# Patient Record
Sex: Male | Born: 1985 | Race: White | Hispanic: No | Marital: Single | Smoking: Former smoker
Health system: Southern US, Community
[De-identification: ages and names within clinical notes are randomized; demographics above are authoritative.]

## PROBLEM LIST (undated history)

## (undated) HISTORY — PX: TONSILLECTOMY: SUR1361

## (undated) HISTORY — PX: NOSE SURGERY: SHX723

---
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
## 2004-05-07 ENCOUNTER — Emergency Department: Payer: Self-pay | Admitting: Emergency Medicine

## 2004-12-15 ENCOUNTER — Emergency Department: Payer: Self-pay | Admitting: Emergency Medicine

## 2005-07-22 ENCOUNTER — Emergency Department: Payer: Self-pay | Admitting: Emergency Medicine

## 2005-10-29 ENCOUNTER — Emergency Department: Payer: Self-pay | Admitting: Emergency Medicine

## 2006-07-22 ENCOUNTER — Emergency Department: Payer: Self-pay | Admitting: Emergency Medicine

## 2007-01-02 ENCOUNTER — Emergency Department: Payer: Self-pay

## 2007-01-30 ENCOUNTER — Other Ambulatory Visit: Payer: Self-pay

## 2007-01-30 ENCOUNTER — Emergency Department: Payer: Self-pay | Admitting: Emergency Medicine

## 2007-05-19 ENCOUNTER — Emergency Department: Payer: Self-pay | Admitting: Emergency Medicine

## 2008-02-03 ENCOUNTER — Emergency Department: Payer: Self-pay | Admitting: Emergency Medicine

## 2008-04-02 ENCOUNTER — Emergency Department: Payer: Self-pay | Admitting: Emergency Medicine

## 2008-04-03 ENCOUNTER — Ambulatory Visit: Payer: Self-pay | Admitting: Emergency Medicine

## 2008-05-06 ENCOUNTER — Emergency Department: Payer: Self-pay | Admitting: Emergency Medicine

## 2008-09-15 ENCOUNTER — Emergency Department: Payer: Self-pay | Admitting: Emergency Medicine

## 2008-10-26 ENCOUNTER — Emergency Department: Payer: Self-pay | Admitting: Internal Medicine

## 2009-12-31 ENCOUNTER — Emergency Department: Payer: Self-pay | Admitting: Emergency Medicine

## 2011-06-02 ENCOUNTER — Emergency Department: Payer: Self-pay | Admitting: Emergency Medicine

## 2011-10-13 ENCOUNTER — Emergency Department: Payer: Self-pay | Admitting: Emergency Medicine

## 2012-04-19 ENCOUNTER — Emergency Department: Payer: Self-pay | Admitting: Emergency Medicine

## 2012-04-19 LAB — URINALYSIS, COMPLETE
Blood: NEGATIVE
Glucose,UR: NEGATIVE mg/dL (ref 0–75)
Ketone: NEGATIVE
Leukocyte Esterase: NEGATIVE
Ph: 5 (ref 4.5–8.0)
Protein: NEGATIVE
RBC,UR: <1 /HPF (ref 0–5)
WBC UR: 1 /HPF (ref 0–5)

## 2012-04-19 LAB — COMPREHENSIVE METABOLIC PANEL
Albumin: 4.2 g/dL (ref 3.4–5.0)
Alkaline Phosphatase: 85 U/L (ref 50–136)
Anion Gap: 7 (ref 7–16)
BUN: 26 mg/dL — ABNORMAL HIGH (ref 7–18)
Bilirubin,Total: 1.1 mg/dL — ABNORMAL HIGH (ref 0.2–1.0)
Calcium, Total: 8.9 mg/dL (ref 8.5–10.1)
Glucose: 102 mg/dL — ABNORMAL HIGH (ref 65–99)
Potassium: 3.8 mmol/L (ref 3.5–5.1)
SGOT(AST): 32 U/L (ref 15–37)
Sodium: 137 mmol/L (ref 136–145)
Total Protein: 7.8 g/dL (ref 6.4–8.2)

## 2012-04-19 LAB — CBC
HCT: 48.7 % (ref 40.0–52.0)
HGB: 17 g/dL (ref 13.0–18.0)
MCHC: 34.8 g/dL (ref 32.0–36.0)
MCV: 86 fL (ref 80–100)
RBC: 5.67 10*6/uL (ref 4.40–5.90)
RDW: 12.6 % (ref 11.5–14.5)

## 2013-02-21 ENCOUNTER — Emergency Department: Payer: Self-pay | Admitting: Emergency Medicine

## 2013-06-17 ENCOUNTER — Emergency Department: Payer: Self-pay | Admitting: Emergency Medicine

## 2013-06-17 LAB — URINALYSIS, COMPLETE
BLOOD: NEGATIVE
Bacteria: NONE SEEN
Bilirubin,UR: NEGATIVE
GLUCOSE, UR: NEGATIVE mg/dL (ref 0–75)
KETONE: NEGATIVE
Leukocyte Esterase: NEGATIVE
Nitrite: NEGATIVE
Ph: 7 (ref 4.5–8.0)
Protein: NEGATIVE
RBC,UR: <1 /HPF (ref 0–5)
Specific Gravity: 1.017 (ref 1.003–1.030)

## 2013-06-17 LAB — COMPREHENSIVE METABOLIC PANEL
ANION GAP: 5 — AB (ref 7–16)
AST: 13 U/L — AB (ref 15–37)
Albumin: 4.6 g/dL (ref 3.4–5.0)
Alkaline Phosphatase: 71 U/L
BILIRUBIN TOTAL: 1 mg/dL (ref 0.2–1.0)
BUN: 20 mg/dL — ABNORMAL HIGH (ref 7–18)
Calcium, Total: 9.6 mg/dL (ref 8.5–10.1)
Chloride: 105 mmol/L (ref 98–107)
Co2: 29 mmol/L (ref 21–32)
Creatinine: 0.79 mg/dL (ref 0.60–1.30)
EGFR (African American): >60
EGFR (Non-African Amer.): >60
GLUCOSE: 119 mg/dL — AB (ref 65–99)
Osmolality: 281 (ref 275–301)
Potassium: 3.3 mmol/L — ABNORMAL LOW (ref 3.5–5.1)
SGPT (ALT): 25 U/L (ref 12–78)
SODIUM: 139 mmol/L (ref 136–145)
TOTAL PROTEIN: 7.9 g/dL (ref 6.4–8.2)

## 2013-06-17 LAB — CBC
HCT: 46.7 % (ref 40.0–52.0)
HGB: 15.9 g/dL (ref 13.0–18.0)
MCH: 29.2 pg (ref 26.0–34.0)
MCHC: 34 g/dL (ref 32.0–36.0)
MCV: 86 fL (ref 80–100)
PLATELETS: 183 10*3/uL (ref 150–440)
RBC: 5.44 10*6/uL (ref 4.40–5.90)
RDW: 13.1 % (ref 11.5–14.5)
WBC: 7.9 10*3/uL (ref 3.8–10.6)

## 2013-06-17 LAB — LIPASE, BLOOD: Lipase: 826 U/L — ABNORMAL HIGH (ref 73–393)

## 2013-06-17 LAB — ETHANOL: Ethanol %: <0.003 % (ref 0.000–0.080)

## 2014-06-21 IMAGING — CT CT ABD-PELV W/ CM
2 of 5 series · 16 of 46 positions shown, 18 images · IV contrast (isovue)
Comparison: 04/02/2008

CLINICAL DATA: Left upper quadrant/left flank pain, weight loss

EXAM:
CT ABDOMEN AND PELVIS WITH CONTRAST
TECHNIQUE: Multidetector CT imaging of the abdomen and pelvis was performed
using the standard protocol following bolus administration of
intravenous contrast.
CONTRAST:  100 mL Isovue 370 IV

[Series 2: routine abd pel with · axial · 0.68mm/px · z∈[-416,-40]mm · 13 of 85 slices shown, 15 images]
[im 5/85  soft-tissue]
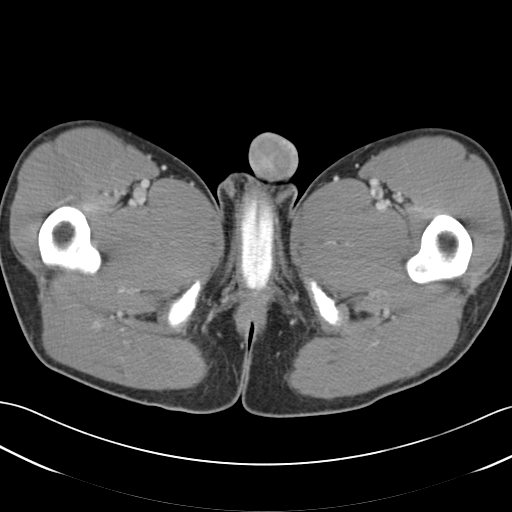
[im 5/85  bone]
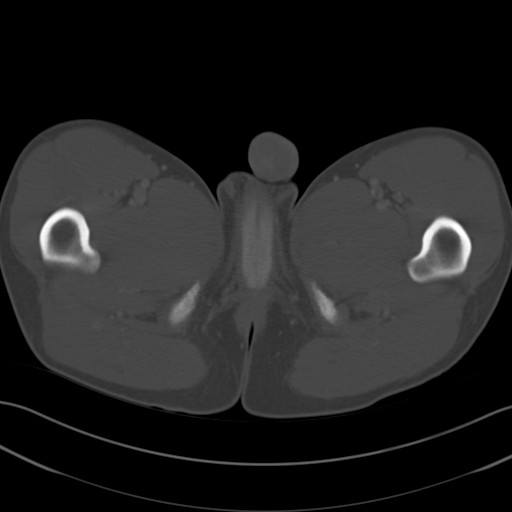
[im 13/85  soft-tissue]
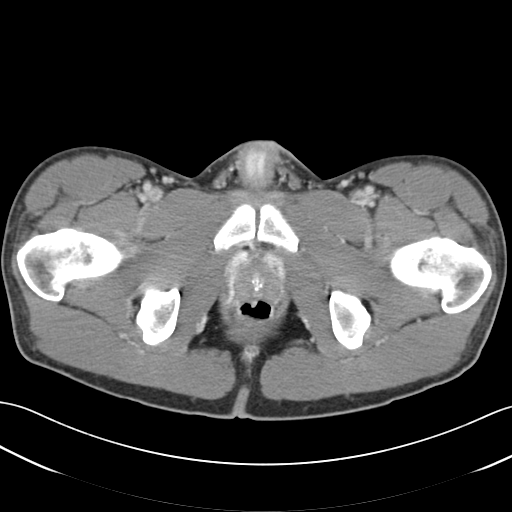
[im 17/85  soft-tissue]
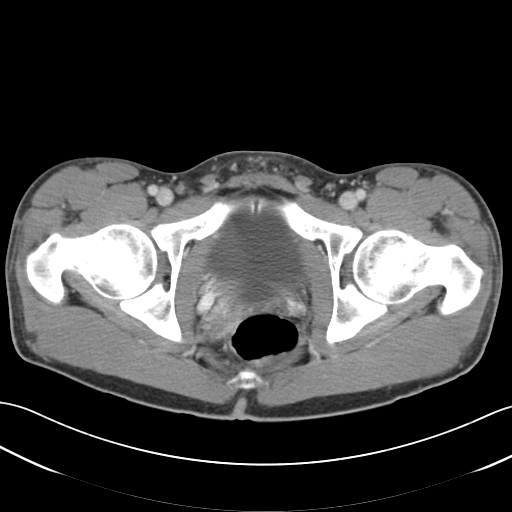
[im 26/85  soft-tissue]
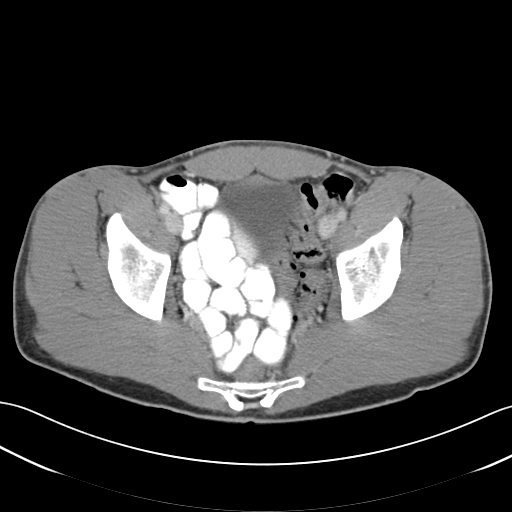
[im 30/85  soft-tissue]
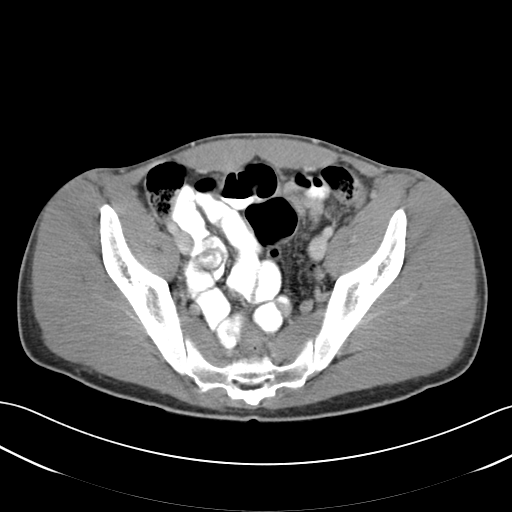
[im 38/85  soft-tissue]
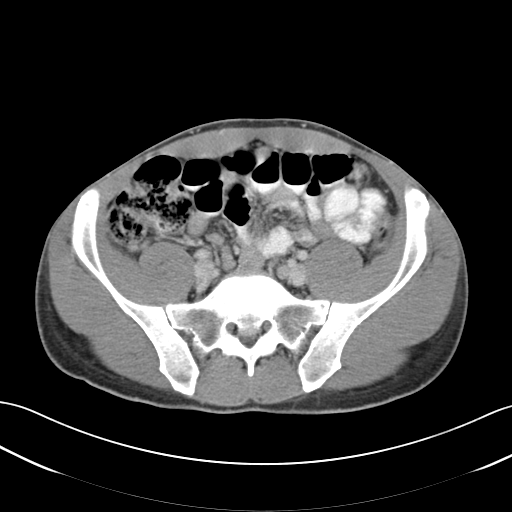
[im 43/85  soft-tissue]
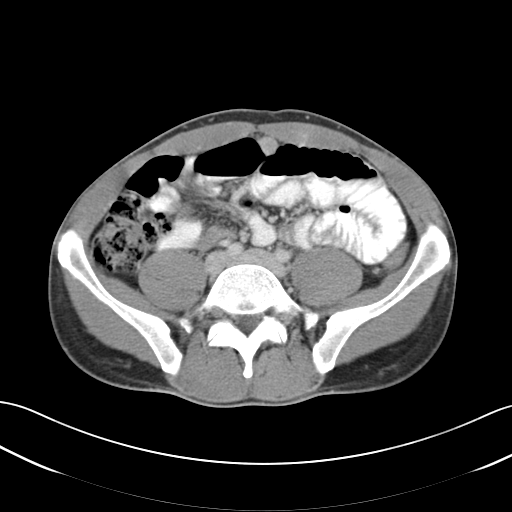
[im 47/85  soft-tissue]
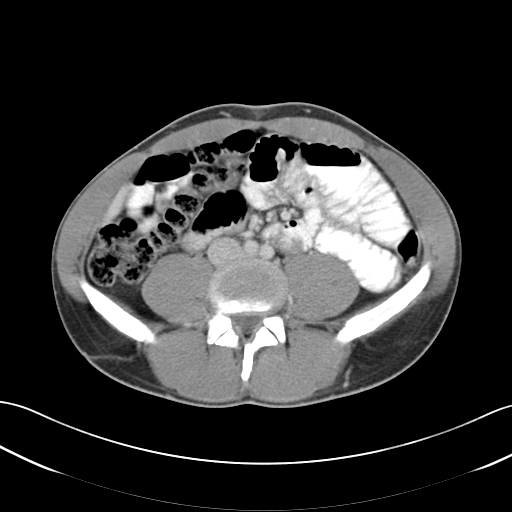
[im 55/85  soft-tissue]
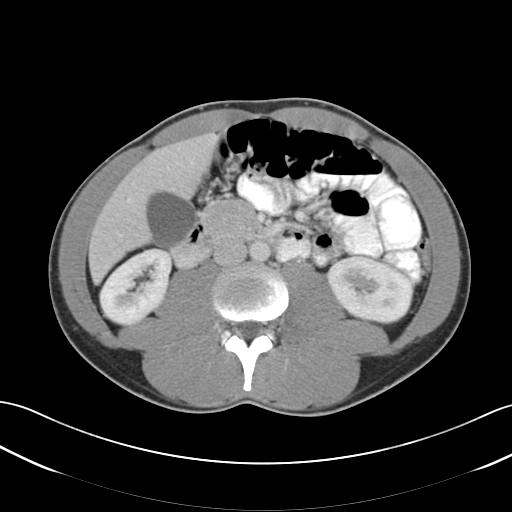
[im 55/85  bone]
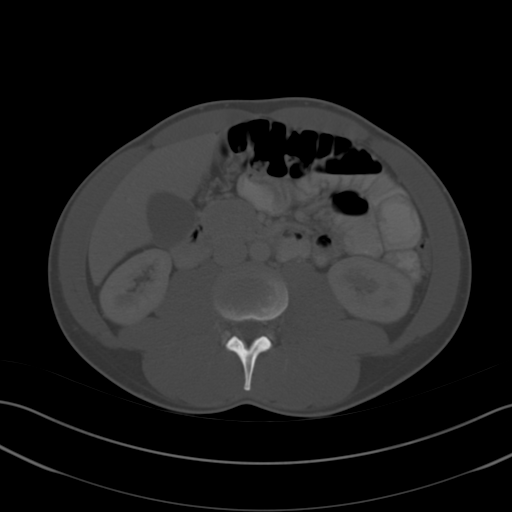
[im 59/85  soft-tissue]
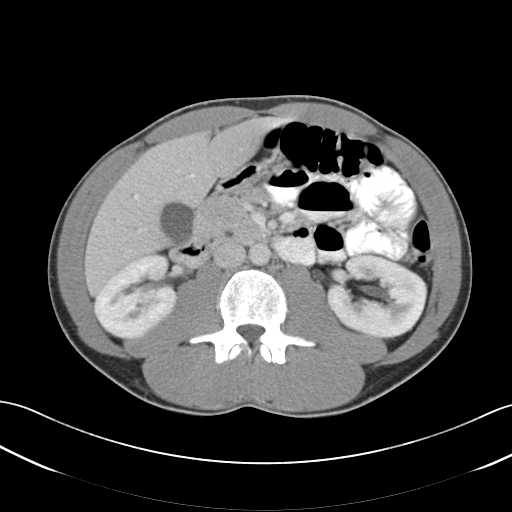
[im 68/85  soft-tissue]
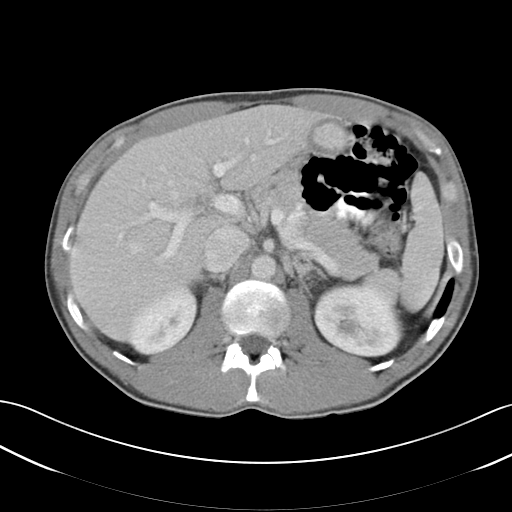
[im 72/85  soft-tissue]
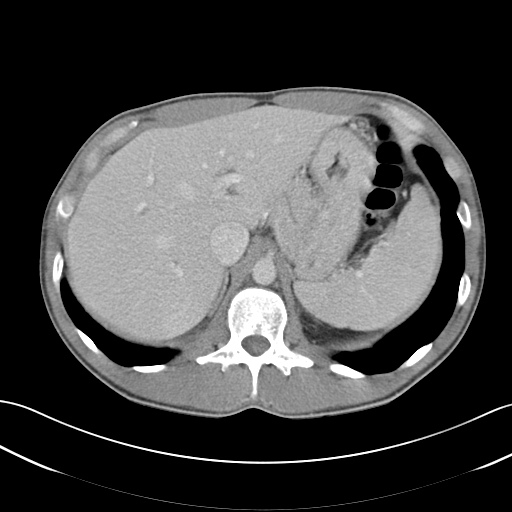
[im 80/85  soft-tissue]
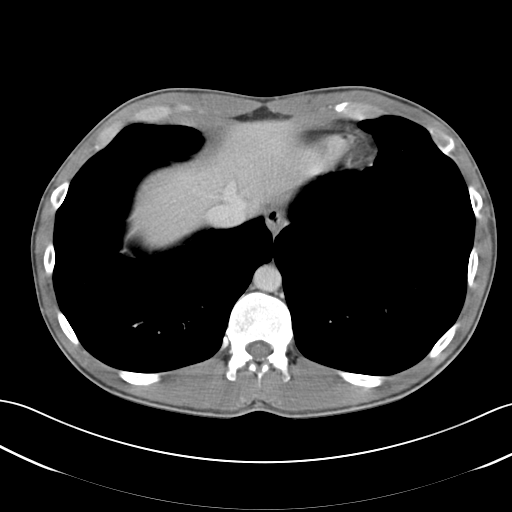

[Series 6: cor routine abd pel with · coronal · 0.70mm/px · 3 of 123 slices shown]
[im 41/123  soft-tissue]
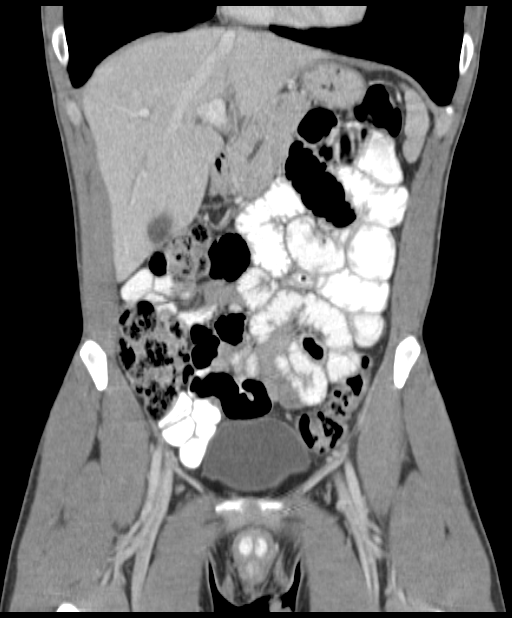
[im 55/123  soft-tissue]
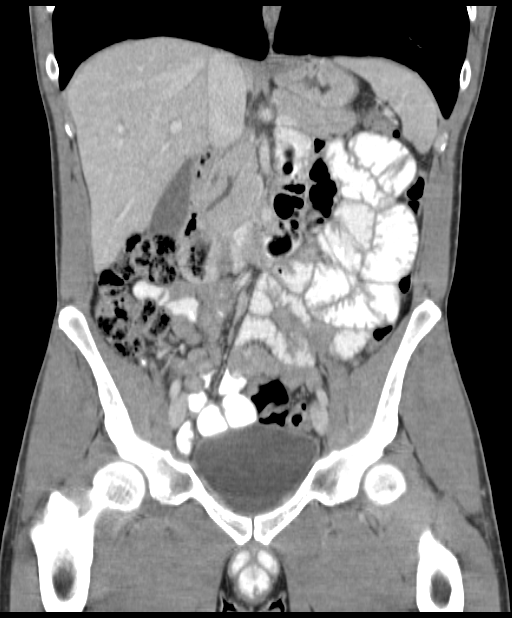
[im 68/123  soft-tissue]
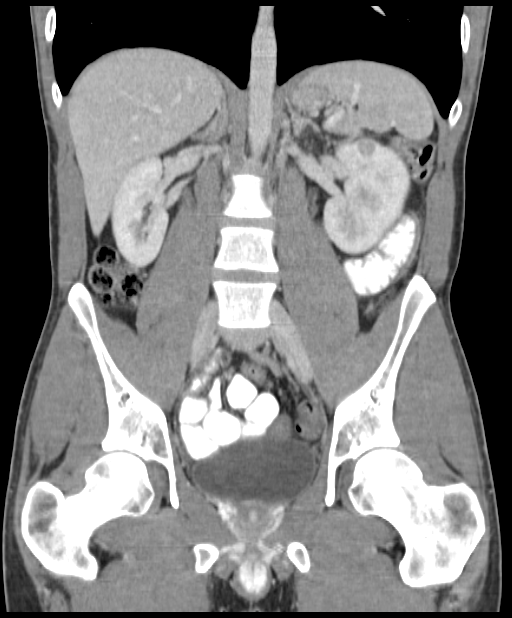

[16 of 46 positions shown; findings below may reference images not displayed]

FINDINGS: Lung bases are clear.

Liver, spleen, pancreas, and adrenal glands are within normal
limits.

Gallbladder is unremarkable. No intrahepatic or extrahepatic ductal
dilatation.

13 mm hemorrhagic cyst in the anterior left upper kidney (series 2/
image 21), previously 10 mm. While technically indeterminate on the
current study, this can be reliably characterized as a benign
hemorrhagic cyst given HUs > 70 on the prior unenhanced CT.

Right kidney is within normal limits.  No hydronephrosis.

No evidence of bowel obstruction.  Normal appendix.

No evidence of abdominal aortic aneurysm.

No abdominopelvic ascites.

No suspicious abdominopelvic lymphadenopathy.

Prostate is unremarkable.

Bladder is within normal limits.

Visualized osseous structures are within normal limits.
IMPRESSION: 13 mm hemorrhagic cyst in the anterior left upper kidney.

Otherwise, negative CT abdomen/pelvis.

## 2016-05-18 ENCOUNTER — Encounter: Payer: Self-pay | Admitting: Emergency Medicine

## 2016-05-18 ENCOUNTER — Emergency Department
Admission: EM | Admit: 2016-05-18 | Discharge: 2016-05-18 | Disposition: A | Discharge status: Home / Self Care | Payer: Self-pay | Attending: Emergency Medicine | Admitting: Emergency Medicine

## 2016-05-18 DIAGNOSIS — Z87891 Personal history of nicotine dependence: Secondary | ICD-10-CM | POA: Insufficient documentation

## 2016-05-18 DIAGNOSIS — Y929 Unspecified place or not applicable: Secondary | ICD-10-CM | POA: Insufficient documentation

## 2016-05-18 DIAGNOSIS — Y9389 Activity, other specified: Secondary | ICD-10-CM | POA: Insufficient documentation

## 2016-05-18 DIAGNOSIS — M62838 Other muscle spasm: Secondary | ICD-10-CM | POA: Insufficient documentation

## 2016-05-18 DIAGNOSIS — X503XXA Overexertion from repetitive movements, initial encounter: Secondary | ICD-10-CM | POA: Insufficient documentation

## 2016-05-18 DIAGNOSIS — Y999 Unspecified external cause status: Secondary | ICD-10-CM | POA: Insufficient documentation

## 2016-05-18 MED ORDER — NAPROXEN 500 MG PO TABS: 500.0000 mg | ORAL_TABLET | Freq: Two times a day (BID) | ORAL | 0 refills | Status: AC

## 2016-05-18 NOTE — ED Notes (Signed)
See triage note  States he developed pain to posterior neck which moves into right arm  Increased pain with movement and inspiration   Denies injury  States pain started suddenly while in church this am

## 2016-05-18 NOTE — ED Triage Notes (Signed)
Pt started with right shoulder pain today. Pain also in right neck. Unable to lift arm much. Pain worse with movement.

## 2016-05-18 NOTE — ED Provider Notes (Signed)
[facility] Emergency Department Provider Note  ____________________________________________  Time seen: Approximately 1:43 PM  I have reviewed the triage vital signs and the nursing notes.   HISTORY  Chief Complaint Shoulder Pain    HPI Jared Gallegos is a 30 y.o. male presents to the emergency department withright-sided neck pain and upper right back pain since 11 AM. Patient was sitting in church when pain began. Patient notes that the pain begins pn right side of neck and shoots across upper back and into his shoulder. Patient has pain when turning head to the right. Patient can turn head to the left. Patient has full range of motion in shoulder. Patient has been taking ibuprofen for symptoms. No fever, numbness, tingling, chills, vomiting, loss of bladder control.   History reviewed. No pertinent past medical history.  There are no active problems to display for this patient.   Past Surgical History:  Procedure Laterality Date  . NOSE SURGERY    . TONSILLECTOMY      Prior to Admission medications   Medication Sig Start Date End Date Taking? Authorizing Provider  naproxen (NAPROSYN) 500 MG tablet Take 1 tablet (500 mg total) by mouth 2 (two) times daily with a meal. 05/18/16 05/18/17  Enid DerryAshley Mykaylah Ballman, PA-C    Allergies Patient has no known allergies.  History reviewed. No pertinent family history.  Social History Social History  Substance Use Topics  . Smoking status: Former Games developermoker  . Smokeless tobacco: Never Used  . Alcohol use No     Review of Systems  Constitutional: No fever/chills ENT: No upper respiratory complaints. Cardiovascular: No chest pain. Respiratory: No cough. No SOB. Gastrointestinal: No abdominal pain.  No nausea, no vomiting.  Genitourinary: Negative for dysuria. Musculoskeletal: Negative for musculoskeletal pain. Skin: Negative for rash, abrasions, lacerations, ecchymosis. Neurological: Negative for headaches,  numbness or tingling   ____________________________________________   PHYSICAL EXAM:  VITAL SIGNS: ED Triage Vitals  Enc Vitals Group     BP 05/18/16 1311 133/76     Pulse Rate 05/18/16 1311 (!) 58     Resp 05/18/16 1311 16     Temp 05/18/16 1311 97.5 F (36.4 C)     Temp Source 05/18/16 1311 Oral     SpO2 05/18/16 1311 100 %     Weight 05/18/16 1312 150 lb (68 kg)     Height 05/18/16 1312 5\' 9"  (1.753 m)     Head Circumference --      Peak Flow --      Pain Score 05/18/16 1312 10     Pain Loc --      Pain Edu? --      Excl. in GC? --      Constitutional: Alert and oriented. Well appearing and in no acute distress. Eyes: Conjunctivae are normal. PERRL. EOMI. Head: Atraumatic. ENT:      Ears:      Nose: No congestion/rhinnorhea.      Mouth/Throat: Mucous membranes are moist.  Neck: No stridor.  No cervical spine tenderness to palpation. Hematological/Lymphatic/Immunilogical: No cervical lymphadenopathy. Cardiovascular: Normal rate, regular rhythm. Normal S1 and S2.  Good peripheral circulation. Respiratory: Normal respiratory effort without tachypnea or retractions. Lungs CTAB. Good air entry to the bases with no decreased or absent breath sounds. Musculoskeletal: Tenderness to palpation throughout upper right back. No tenderness over cervical for backache or lumbar spine. Full range of motion of right shoulder. Difficulty rotating neck to the right. Able to rotate neck to the left. No  gross deformities appreciated. Neurologic:  Normal speech and language. No gross focal neurologic deficits are appreciated.  Cranial nerves: 2-10 normal as tested. Strength 5/5 in upper and lower extremities Cerebellar: Finger-nose-finger WNL, Heel to shin WNL Sensorimotor: No pronator drift, clonus, sensory loss or abnormal reflexes. No vision deficits noted to confrontation bilaterally.  Speech: No dysarthria or expressive aphasia Skin:  Skin is warm, dry and intact. No rash  noted. Psychiatric: Mood and affect are normal. Speech and behavior are normal. Patient exhibits appropriate insight and judgement.   ____________________________________________   LABS (all labs ordered are listed, but only abnormal results are displayed)  Labs Reviewed - No data to display ____________________________________________  EKG   ____________________________________________  RADIOLOGY   No results found.  ____________________________________________    PROCEDURES  Procedure(s) performed:    Procedures    Medications - No data to display   ____________________________________________   INITIAL IMPRESSION / ASSESSMENT AND PLAN / ED COURSE  Pertinent labs & imaging results that were available during my care of the patient were reviewed by me and considered in my medical decision making (see chart for details).  Review of the [place] CSRS was performed in accordance of the [facility] prior to dispensing any controlled drugs.  Clinical Course     Patient's diagnosis is consistent with muscle spasms after chopping wood yesterday. No neuro deficits. Patient will be discharged home with prescriptions for naprosyn. Patient is to follow up with PCP as directed. Patient is given ED precautions to return to the ED for any worsening or new symptoms. No evidence of stroke, cauda hematoma, vascular catastrophe, spinal abscess/hematoma, or referred intra-abdominal pain. All patients questions questions were answered.     ____________________________________________  FINAL CLINICAL IMPRESSION(S) / ED DIAGNOSES  Final diagnoses:  Muscle spasm      NEW MEDICATIONS STARTED DURING THIS VISIT:  New Prescriptions   NAPROXEN (NAPROSYN) 500 MG TABLET    Take 1 tablet (500 mg total) by mouth 2 (two) times daily with a meal.        This chart was dictated using voice recognition software/Dragon. Despite best efforts to proofread, errors can occur which can change the  meaning. Any change was purely unintentional.    Enid DerryAshley Ladashia Demarinis, PA-C 05/18/16 1405    Jennye MoccasinBrian S Quigley, MD 05/18/16 [phone]

## 2017-02-04 ENCOUNTER — Emergency Department
Admission: EM | Admit: 2017-02-04 | Discharge: 2017-02-04 | Disposition: A | Discharge status: Home / Self Care | Payer: Self-pay | Attending: Student in an Organized Health Care Education/Training Program | Admitting: Student in an Organized Health Care Education/Training Program

## 2017-02-04 ENCOUNTER — Encounter: Payer: Self-pay | Admitting: *Deleted

## 2017-02-04 DIAGNOSIS — R112 Nausea with vomiting, unspecified: Secondary | ICD-10-CM | POA: Insufficient documentation

## 2017-02-04 DIAGNOSIS — Z87891 Personal history of nicotine dependence: Secondary | ICD-10-CM | POA: Insufficient documentation

## 2017-02-04 DIAGNOSIS — W57XXXA Bitten or stung by nonvenomous insect and other nonvenomous arthropods, initial encounter: Secondary | ICD-10-CM | POA: Insufficient documentation

## 2017-02-04 LAB — COMPREHENSIVE METABOLIC PANEL
ALT: 12 U/L — ABNORMAL LOW (ref 17–63)
AST: 17 U/L (ref 15–41)
Albumin: 4.5 g/dL (ref 3.5–5.0)
Alkaline Phosphatase: 59 U/L (ref 38–126)
Anion gap: 7 (ref 5–15)
BUN: 18 mg/dL (ref 6–20)
CHLORIDE: 103 mmol/L (ref 101–111)
CO2: 29 mmol/L (ref 22–32)
Calcium: 9.5 mg/dL (ref 8.9–10.3)
Creatinine, Ser: 0.7 mg/dL (ref 0.61–1.24)
Glucose, Bld: 98 mg/dL (ref 65–99)
POTASSIUM: 3.6 mmol/L (ref 3.5–5.1)
SODIUM: 139 mmol/L (ref 135–145)
Total Bilirubin: 0.4 mg/dL (ref 0.3–1.2)
Total Protein: 7.1 g/dL (ref 6.5–8.1)

## 2017-02-04 LAB — CBC
HEMATOCRIT: 42.6 % (ref 40.0–52.0)
HEMOGLOBIN: 15.1 g/dL (ref 13.0–18.0)
MCH: 31 pg (ref 26.0–34.0)
MCHC: 35.6 g/dL (ref 32.0–36.0)
MCV: 87.2 fL (ref 80.0–100.0)
Platelets: 170 10*3/uL (ref 150–440)
RBC: 4.88 MIL/uL (ref 4.40–5.90)
RDW: 13.4 % (ref 11.5–14.5)
WBC: 7.3 10*3/uL (ref 3.8–10.6)

## 2017-02-04 LAB — LIPASE, BLOOD: LIPASE: 45 U/L (ref 11–51)

## 2017-02-04 MED ORDER — PROMETHAZINE HCL 12.5 MG PO TABS: 12.5000 mg | ORAL_TABLET | Freq: Four times a day (QID) | ORAL | 0 refills | Status: AC | PRN

## 2017-02-04 MED ORDER — PROMETHAZINE HCL 25 MG/ML IJ SOLN
25.0000 mg | Freq: Four times a day (QID) | INTRAMUSCULAR | Status: DC | PRN
  Administered 2017-02-04: 25 mg via INTRAMUSCULAR
  Filled 2017-02-04: qty 1

## 2017-02-04 MED ORDER — DOXYCYCLINE HYCLATE 50 MG PO CAPS: 100.0000 mg | ORAL_CAPSULE | Freq: Two times a day (BID) | ORAL | 0 refills | Status: AC

## 2017-02-04 NOTE — ED Triage Notes (Signed)
States he pulled a tick of his buttocks 2 nights ago, now states rash, diarrhea and nausea

## 2017-02-04 NOTE — ED Provider Notes (Signed)
[facility] Emergency Department Provider Note    First MD Initiated Contact with Patient 02/04/17 1816     (approximate)  I have reviewed the triage vital signs and the nursing notes.   HISTORY  Chief Complaint Nausea and Diarrhea    HPI Jared Gallegos is a 31 y.o. male presents to complain of nausea vomiting and some loose stools associated with generalized malaise. States he did notice a large engorged tick on scan earlier today and he removed this. Is unsure of how long they have been there. Does notice some redness around the area but no other associated rashes. No neck stiffness. No chest pain or shortness of breath. Patient does smoke. Does have a history of pancreatitis but this feels different. Denies any burning when he urinates. Denies any abdominal pain.   History reviewed. No pertinent past medical history. History reviewed. No pertinent family history. Past Surgical History:  Procedure Laterality Date  . NOSE SURGERY    . TONSILLECTOMY     There are no active problems to display for this patient.     Prior to Admission medications   Medication Sig Start Date End Date Taking? Authorizing Provider  naproxen (NAPROSYN) 500 MG tablet Take 1 tablet (500 mg total) by mouth 2 (two) times daily with a meal. 05/18/16 05/18/17  Enid Derry, PA-C    Allergies Bee venom    Social History Social History  Substance Use Topics  . Smoking status: Former Games developer  . Smokeless tobacco: Never Used  . Alcohol use No    Review of Systems Patient denies headaches, rhinorrhea, blurry vision, numbness, shortness of breath, chest pain, edema, cough, abdominal pain, nausea, vomiting, diarrhea, dysuria, fevers, rashes or hallucinations unless otherwise stated above in HPI. ____________________________________________   PHYSICAL EXAM:  VITAL SIGNS: Vitals:   02/04/17 1637  BP: 116/68  Pulse: 73  Resp: 16  Temp: 98.6 F (37 C)  SpO2: 98%      Constitutional: Alert and oriented. Well appearing and in no acute distress. Eyes: Conjunctivae are normal.  Head: Atraumatic. Nose: No congestion/rhinnorhea. Mouth/Throat: Mucous membranes are moist.   Neck: No stridor. Painless ROM.  Cardiovascular: Normal rate, regular rhythm. Grossly normal heart sounds.  Good peripheral circulation. Respiratory: Normal respiratory effort.  No retractions. Lungs CTAB. Gastrointestinal: Soft and nontender. No distention. No abdominal bruits. No CVA tenderness. Genitourinary:  Musculoskeletal: No lower extremity tenderness nor edema.  No joint effusions. Neurologic:  Normal speech and language. No gross focal neurologic deficits are appreciated. No facial droop Skin:  Skin is warm, dry and intact. No bulls eye rash or petechia Psychiatric: Mood and affect are normal. Speech and behavior are normal.  ____________________________________________   LABS (all labs ordered are listed, but only abnormal results are displayed)  Results for orders placed or performed during the hospital encounter of 02/04/17 (from the past 24 hour(s))  Lipase, blood     Status: None   Collection Time: 02/04/17  4:38 PM  Result Value Ref Range   Lipase 45 11 - 51 U/L  Comprehensive metabolic panel     Status: Abnormal   Collection Time: 02/04/17  4:38 PM  Result Value Ref Range   Sodium 139 135 - 145 mmol/L   Potassium 3.6 3.5 - 5.1 mmol/L   Chloride 103 101 - 111 mmol/L   CO2 29 22 - 32 mmol/L   Glucose, Bld 98 65 - 99 mg/dL   BUN 18 6 - 20 mg/dL   Creatinine,  Ser 0.70 0.61 - 1.24 mg/dL   Calcium 9.5 8.9 - 16.110.3 mg/dL   Total Protein 7.1 6.5 - 8.1 g/dL   Albumin 4.5 3.5 - 5.0 g/dL   AST 17 15 - 41 U/L   ALT 12 (L) 17 - 63 U/L   Alkaline Phosphatase 59 38 - 126 U/L   Total Bilirubin 0.4 0.3 - 1.2 mg/dL   GFR calc non Af Amer >60 >60 mL/min   GFR calc Af Amer >60 >60 mL/min   Anion gap 7 5 - 15  CBC     Status: None   Collection Time: 02/04/17  4:38 PM   Result Value Ref Range   WBC 7.3 3.8 - 10.6 K/uL   RBC 4.88 4.40 - 5.90 MIL/uL   Hemoglobin 15.1 13.0 - 18.0 g/dL   HCT 09.642.6 04.540.0 - 40.952.0 %   MCV 87.2 80.0 - 100.0 fL   MCH 31.0 26.0 - 34.0 pg   MCHC 35.6 32.0 - 36.0 g/dL   RDW 81.113.4 91.411.5 - 78.214.5 %   Platelets 170 150 - 440 K/uL   ____________________________________________ ____________________________________________   PROCEDURES  Procedure(s) performed:  Procedures    Critical Care performed: no ____________________________________________   INITIAL IMPRESSION / ASSESSMENT AND PLAN / ED COURSE  Pertinent labs & imaging results that were available during my care of the patient were reviewed by me and considered in my medical decision making (see chart for details).  DDX: uri, enteritis, gastritis, pancreatitis, appendicitis, tick borne illness  Jared Gallegos is a 31 y.o. who presents to the ED with above symptoms. Patient otherwise well appearing and in no acute distress. Abdominal exam is soft and benign. This is not clinically consistent with an acute intra-abdominal process. Blood work is otherwise reassuring. Do not feel further diagnostic testing clinically indicated. Given his constellation of symptoms I will have her initiate empiric treatment of tickborne illness with doxycycline. Patient given a dose of IM Phenergan. Tolerating oral hydration and otherwise currently stable for follow-up with PCP.      ____________________________________________   FINAL CLINICAL IMPRESSION(S) / ED DIAGNOSES  Final diagnoses:  Nausea and vomiting, intractability of vomiting not specified, unspecified vomiting type  Tick bite, initial encounter      NEW MEDICATIONS STARTED DURING THIS VISIT:  New Prescriptions   No medications on file     Note:  This document was prepared using Dragon voice recognition software and may include unintentional dictation errors.    Willy Eddyobinson, Maximiano Lott, MD 02/04/17 [phone]

## 2017-02-04 NOTE — ED Notes (Signed)
ED Provider at bedside. 

## 2017-02-04 NOTE — Discharge Instructions (Signed)
Keep out of the sun or be sure to wear protective clothing or sunscreen while taking this antibiotic.
